# Patient Record
Sex: Female | Born: 1950 | Race: White | Hispanic: No | Marital: Married | Smoking: Current every day smoker
Health system: Southern US, Community
[De-identification: ages and names within clinical notes are randomized; demographics above are authoritative.]

---

## 1998-02-15 ENCOUNTER — Emergency Department (HOSPITAL_COMMUNITY): Admission: EM | Admit: 1998-02-15 | Discharge: 1998-02-15 | Payer: Self-pay | Admitting: Emergency Medicine

## 1998-11-11 ENCOUNTER — Other Ambulatory Visit: Admission: RE | Admit: 1998-11-11 | Discharge: 1998-11-11 | Payer: Self-pay | Admitting: Obstetrics and Gynecology

## 1999-01-16 ENCOUNTER — Ambulatory Visit (HOSPITAL_COMMUNITY): Admission: RE | Admit: 1999-01-16 | Discharge: 1999-01-16 | Payer: Self-pay | Admitting: Obstetrics and Gynecology

## 2000-12-12 ENCOUNTER — Inpatient Hospital Stay (HOSPITAL_COMMUNITY): Admission: EM | Admit: 2000-12-12 | Discharge: 2000-12-14 | Payer: Self-pay

## 2000-12-12 ENCOUNTER — Encounter: Payer: Self-pay | Admitting: Urology

## 2000-12-12 ENCOUNTER — Encounter: Admission: RE | Admit: 2000-12-12 | Discharge: 2000-12-12 | Payer: Self-pay | Admitting: Urology

## 2000-12-14 ENCOUNTER — Encounter: Payer: Self-pay | Admitting: Urology

## 2001-01-16 ENCOUNTER — Ambulatory Visit (HOSPITAL_COMMUNITY): Admission: RE | Admit: 2001-01-16 | Discharge: 2001-01-16 | Payer: Self-pay | Admitting: Urology

## 2001-01-17 ENCOUNTER — Encounter: Payer: Self-pay | Admitting: Urology

## 2001-01-17 ENCOUNTER — Inpatient Hospital Stay (HOSPITAL_COMMUNITY): Admission: EM | Admit: 2001-01-17 | Discharge: 2001-01-20 | Payer: Self-pay | Admitting: Emergency Medicine

## 2001-08-31 ENCOUNTER — Other Ambulatory Visit: Admission: RE | Admit: 2001-08-31 | Discharge: 2001-08-31 | Payer: Self-pay | Admitting: Obstetrics and Gynecology

## 2002-04-25 ENCOUNTER — Encounter: Payer: Self-pay | Admitting: Emergency Medicine

## 2002-04-25 ENCOUNTER — Emergency Department (HOSPITAL_COMMUNITY): Admission: EM | Admit: 2002-04-25 | Discharge: 2002-04-25 | Payer: Self-pay | Admitting: Emergency Medicine

## 2003-10-20 ENCOUNTER — Emergency Department (HOSPITAL_COMMUNITY): Admission: EM | Admit: 2003-10-20 | Discharge: 2003-10-21 | Payer: Self-pay | Admitting: Emergency Medicine

## 2004-02-04 ENCOUNTER — Other Ambulatory Visit: Admission: RE | Admit: 2004-02-04 | Discharge: 2004-02-04 | Payer: Self-pay | Admitting: Obstetrics and Gynecology

## 2005-07-24 ENCOUNTER — Encounter: Admission: RE | Admit: 2005-07-24 | Discharge: 2005-07-24 | Payer: Self-pay | Admitting: Neurosurgery

## 2005-07-30 ENCOUNTER — Inpatient Hospital Stay (HOSPITAL_COMMUNITY): Admission: RE | Admit: 2005-07-30 | Discharge: 2005-07-31 | Payer: Self-pay | Admitting: Neurosurgery

## 2009-03-12 ENCOUNTER — Encounter: Admission: RE | Admit: 2009-03-12 | Discharge: 2009-03-12 | Payer: Self-pay | Admitting: Neurosurgery

## 2010-09-18 NOTE — Discharge Summary (Signed)
[facility]  Patient:    Victoria Aguilar, Victoria Aguilar Visit Number: [id number] MRN: [id number]          Service Type: MED Location: [phone] Attending Physician:  Liborio Nixon Dictated by:   Bertram Millard. Dahlstedt, M.D. Admit Date:  01/17/2001 Discharge Date: 01/20/2001                             Discharge Summary  REASON FOR ADMISSION:  Flank pain.  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  This has been stable with adequate emptying of her kidney recently.  She has been treated for a right ureteral calculus recently.  A stent was placed, which dislodged and fragmented the stone.  The stent was removed in the office recently.  She presented recently with recurrent right flank pain, nausea, and vomiting.  She was seen in the office with significant pain, and I felt that it was necessary to admit this patient for hydration and IV analgesic management.  She has had nausea and vomiting but no fevers or chills.  She denies any gross hematuria.  She has had prior kidney stones with resultant ureteroscopy.  PAST MEDICAL HISTORY:  Past medical history is quite complicated and is significant for several back operations.  She has a chronic pain syndrome and is maintained on methadone.  She is status post a partial hysterectomy.  MEDICATIONS:  Methadone 10 mg four times a day, Percocet one p.o. t.i.d. p.r.n., Valium 5 mg t.i.d. p.r.n., and Estratest.  ALLERGIES:  She is allergic to NONSTEROIDALS.  For prior medical history, please see admission health history.  SOCIAL HISTORY, REVIEW OF SYSTEMS:  Deferred.  PHYSICAL EXAMINATION:  VITAL SIGNS:  Blood pressure 111/82, pulse 114, respiratory rate 18, temperature 98.7.  GENERAL:  A middle-aged female in significant distress.  HEENT:  Unremarkable.  CHEST:  Lungs were clear bilaterally.  CARDIAC:   Regular rhythm, increased rate.  No rubs, gallops, or murmurs.  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  No hepatosplenomegaly or masses were noted.  GENITOURINARY:  Genitalia not examined.  HOSPITAL COURSE:  The patient was admitted directly to my service.  With her being put on a Dilaudid PCA, she became fairly comfortable.  It was found that she had a right distal ureteral stone.  She began having increased pain on the second hospital day.  At that time it was felt necessary to proceed with ureteroscopy.  This was performed, and a stent was placed.  No stone was seen, but there was a small distal ureteral stricture.  The patient tolerated the procedure well.  She was pain-free after this.  She was discharged home in improved condition.  DISCHARGE MEDICATIONS:  She was discharged on the regular doses of methadone and Percocet as well as Estratest.  She was also placed on cephalexin 500 mg t.i.d. and oxybutynin one every eight hours as needed for urinary frequency. DISCHARGE INSTRUCTIONS:  The string was left on the end of the stent.  It was recommended that the patient pull the string out the Monday following discharge.  She will follow up in my office at a future date.  She was discharged on a regular diet. Dictated by:   Bertram Millard. Dahlstedt, M.D. Attending Physician:  Liborio Nixon DD:  03/13/01 TD:  03/13/01 Job: [id number]  [id number]

## 2010-09-18 NOTE — Discharge Summary (Signed)
[facility]  Patient:    Victoria Aguilar, Victoria Aguilar Visit Number: [id number] MRN: [id number]          Service Type: MED Location: [phone] Attending Physician:  Liborio Nixon Dictated by:   Bertram Millard. Dahlstedt, M.D. Adm. Date:  12/12/2000 Disc. Date: 12/14/2000                             Discharge Summary  DIAGNOSES: 1. Right hydronephrosis. 2. Right ureteral calculus. 3. Retroperitoneal fibrosis.  PROCEDURE:  On 12/14/00, cystoscopy, retrograde, double J stent placement.  HISTORY OF PRESENT ILLNESS:  The patient is a 60 year old female with chronic pain, retroperitoneal fibrosis, who recently presented during a CT scan with significant right flank pain, nausea and vomiting.  She is found to have mild right hydronephrosis.  She has a history of retroperitoneal fibrosis and has manageable renal function since the time of diagnosis with either stents or close followup with imaging.  On the day of admission she had intense pain during a CT scan.  She is admitted for further evaluation and treatment of this right flank pain and hydronephrosis.  HOSPITAL COURSE:  The patient was admitted to my service.  She was actually found to have a right ureteral stone.  She underwent cysto and stent placement.  Pain was managed well after the cysto and stent placement.  The patient was discharged home shortly postoperatively.  FOLLOWUP:  She will follow up in my office in one week with a KUB.  We will try to leave the stent in for ureteral dilation so that she may pass the stone on her own.  DISCHARGE MEDICATIONS: 1. Bactrim DS one p.o. q.12h. x 5 days. 2. Tylox p.r.n. 3. Oxybutynin q.5h. p.r.n. bladder spasms. Dictated by:   Bertram Millard. Dahlstedt, M.D. Attending Physician:  Liborio Nixon DD:  12/22/00 TD:  12/23/00 Job: [id number]

## 2010-09-18 NOTE — Op Note (Signed)
[facility]  Patient:    Victoria Aguilar, Victoria Aguilar                     MRN: [id number] Proc. Date: 12/14/00 Adm. Date:  [id number] Attending:  Laqueta Jean                           Operative Report  PREOPERATIVE DIAGNOSIS:  Right ureteral calculus.  POSTOPERATIVE DIAGNOSIS:  Right ureteral calculus.  PRINCIPAL PROCEDURE:  Cystoscopy, right retrograde ureteral pyelogram, double-J stent placement.  SURGEON:  Bertram Millard. Dahlstedt, M.D.  ANESTHESIA:  General.  COMPLICATIONS:  None.  BRIEF HISTORY:  A 60 year old female with a history of retroperitoneal fibrosis.  I first treated her for a stone quite a few years ago.  Since that time, she had developed retroperitoneal fibrosis and has a history of bilateral hydronephrosis.  Her retroperitoneal fibrosis had been quiescent the past few months, and she had had no hydronephrosis.  She had a follow-up CT scan done two days ago.  During the middle of her CT scan at [facility], she had severe right flank pain.  It was felt that the patients pain was due to her kidney.  She was sent to the emergency room here at [facility] and admitted for pain management.  Later, it was found that she did have a mid ureteral stone on the right.  She has had persistent pain, and at this point, presents to the operating room for cystography and retrograde pyelogram, stent placement, possible ureteroscopy.  DESCRIPTION OF PROCEDURE:  The patient was administered a general anesthetic, placed in the dorsal lithotomy position.  Genitalia and perineum were prepped and draped.  A 25 [place] scope was passed into her bladder which appeared normal.  The right ureteral orifice was cannulated with an end hole catheter, and a retrograde was performed.  There was a persistent filling defect about the L3 to L4 level on the right.  There mild to moderate hydroureter beyond this.  Due to the patients retroperitoneal fibrosis, I felt it best to  place a stent and not perform ureteroscopy.  It turns out that several years ago, stent placement itself caused disintegration of the stone.  A guidewire was placed fluoroscopically into the right renal pelvis and over this a 6 French x 24 cm double-J stent was placed.  Adequate positioning was seen fluoroscopically and cystoscopically.  The scope was removed and the procedure terminated.  The patient tolerated the procedure well, was awakened, and taken to PACU in stable condition. DD:  12/14/00 TD:  12/14/00 Job: [id number]

## 2010-09-18 NOTE — Op Note (Signed)
[facility]  Patient:    AILYN, GLADD Visit Number: [id number] MRN: [id number]          Service Type: MED Location: [phone] Attending Physician:  Liborio Nixon Proc. Date: 01/20/01 Admit Date:  01/17/2001 Discharge Date: 01/20/2001                             Operative Report  PREOPERATIVE DIAGNOSES:  Right ureteral calculus, right flank pain.  POSTOPERATIVE DIAGNOSIS:  Right flank pain, no evidence of ureteral calculus.  PRINCIPAL PROCEDURE:  Cystoscopy, right ureteroscopy, double-J stent placement.  SURGEON:  Bertram Millard. Dahlstedt, M.D.  ANESTHESIA:  General endotracheal.  COMPLICATIONS:  None.  BRIEF HISTORY:  A 60 year old female with recurrent episodes of right flank pain.  She was treated several weeks ago for an obstructing right ureteral calculus.  It was not seen at first on the CT scan.  She began having pain during that CT scan.  A stent was placed subsequently, and the patients flank pain resolved.  She has retroperitoneal fibrosis and some ureteral narrowing due to this.  Her stent was removed last week, and she presented two days ago with right flank pain.  Follow-up CT of the abdomen revealed a 1-2 mm stone in her distal right ureter.  She has had persistent pain and has been maintained on a Dilaudid PCA over the past two days.  She continues with pain today.  At this point, she presents for cystoureteroscopy with possible stone extraction. The risks and complications of this procedure are known to the patient.  She desires to proceed.  DESCRIPTION OF PROCEDURE:  The patient was administered a general endotracheal anesthetic, placed in the dorsal lithotomy position.  Genitalia and perineum were prepped and draped.  A 6 [place] micro-ureterscope was passed directly through her urethra and up into the right ureter.  It was easily traversed. Mild ureteral narrowing distally in the right ureter.  I passed the  scope all the way to the renal pelvis on the right, using direct vision.  No stone was seen.  No ureteral abnormalities were noted.  At this point, the scope was withdrawn.  Cystoscope was placed, and a 6 French x 26 cm double-J stent was placed using cystoscopic and fluoroscopic guidance.  The string was left intact.  Bladder drained, scope removed.  The patient tolerated the procedure well.  She was awakened and extubated and taken to PACU in stable condition.  The string was left taped to her thigh. Attending Physician:  Liborio Nixon DD:  01/20/01 TD:  01/20/01 Job: [id number]

## 2010-09-18 NOTE — Op Note (Signed)
[facility]  Patient:    [name], Victoria Aguilar Visit Number: [id number] MRN: [id number]          Service Type: DSU Location: DAY Attending Physician:  Liborio Nixon Proc. Date: 01/16/01 Admit Date:  01/16/2001                             Operative Report  PREOPERATIVE DIAGNOSIS:  Migrated right ureteral stent.  POSTOPERATIVE DIAGNOSIS:  Migrated right ureteral stent.  OPERATION PERFORMED:  Cysto, ureteroscopy, stent extraction.  SURGEON:  Bertram Millard. Dahlstedt, M.D.  ANESTHESIA:  General with LMA.  COMPLICATIONS:  None.  BRIEF HISTORY:  The patient is a 59 year old female recently diagnosed with a right ureteral stone.  A stent was placed to relieve the obstruction.  This was quite a small stone and it was felt that it was fragmented by stent placement.  Stent extraction was attempted last week and the stent had migrated and was not visible in the bladder.   At this point she presents for stent extraction.  DESCRIPTION OF PROCEDURE:  The patient was administered general anesthetic. She was placed in a dorsal lithotomy position.  Genitalia and perineum were prepped and draped.  The ureteroscope was passed into her right ureter. Attempts as grasping the ureter with three-prong graspers and a basket were unsuccessful.  It could be grasped but not extracted properly.  It was right at the ureteral meatus.  The cystoscope was then placed and a grasping device put through this or up into the ureter.  This stent was grasped without much of a difficulty and removed.  The bladder was drained and the procedure terminated.  The patient tolerated the procedure well and was returned to post anesthesia care unit in stable condition. Attending Physician:  Liborio Nixon DD:  01/16/01 TD:  01/16/01 Job: [id number]

## 2010-09-18 NOTE — Discharge Summary (Signed)
[facility]  Patient:    Victoria Aguilar, Victoria Aguilar Visit Number: [id number] MRN: [id number]          Service Type: MED Location: [phone] Attending Physician:  Liborio Nixon Dictated by:   Bertram Millard. Dahlstedt, M.D. Admit Date:  01/17/2001 Discharge Date: 01/20/2001                             Discharge Summary  DIAGNOSES: 1. Right ureteral calculus. 2. Right hydronephrosis. 3. Retroperitoneal fibrosis. 4. Hypertension. 5. History of tobacco abuse.  PRINCIPAL PROCEDURE:  January 20, 2001, right ureteral calculus.  HISTORY OF PRESENT ILLNESS:  A 60 year old female with retroperitoneal fibrosis and intermittent right hydronephrosis.  She was admitted approximately one month prior to this for a right ureteral calculus.  She underwent stent placement.  This apparently dislodged and fragmented the stone which was not seen on recent KUB.  The first stone was removed recently.  She developed significant right flank pain and is readmitted for treatment of hydronephrosis and flank pain.  She does actually, on follow-up, have a small distal ureteral stone on the right.  HOSPITAL COURSE:  Patient was admitted and underwent cysto, ureteroscopy, and stent placement.  No stone was seen on ureteroscopy.  However, after placement of the stent, her pain was alleviated.  The rest of her hospitalization was uneventful and she was discharged home after her ureteroscopy.  She will follow up in my office in one week to follow up.  She will remove her stent the Monday after discharge.  DISCHARGE MEDICATIONS: 1. Keflex 250 mg three times a day. 2. Ditropan one every 8 hours p.r.n. frequency. Dictated by:   Bertram Millard. Dahlstedt, M.D. Attending Physician:  Liborio Nixon DD:  02/24/01 TD:  02/25/01 Job: [id number]

## 2010-09-18 NOTE — H&P (Signed)
[facility]  Patient:    Victoria Aguilar, Victoria Aguilar                     MRN: [id number] Adm. Date:  [id number] Attending:  Laqueta Jean CC:         Victoria Aguilar, M.D.   History and Physical  HISTORY:  Mrs. Victoria Aguilar is a 60 year old, married white female, with a history of chronic bilateral retroperitoneal fibrosis, who developed a severe pain approximately 5 oclock this afternoon, while having CT scan at [facility]. The scan showed the patient to have bilateral hydronephrosis, right greater than left, with dilated right calices, right greater than left.  The patient is a chronic pain patient on methadone, and Percocet, and is having severe right flank pain and right lower quadrant pain, consistent with a renal colic requiring IV Dilaudid in the emergency room.  Because of the patients severe pain, and because of the requirement of IV Dilaudid for pain control, the patient was admitted and will have right percutaneous nephrostomy tube placed tomorrow morning, August 13. I reviewed the x-rays with Dr. ______, and also ______ with Dr. Cherene Julian, who agreed with this plan.  PAST MEDICAL HISTORY:  Significant for multiple surgeries: appendectomy, cholecystectomy, and multiple urinary stents for retroperitoneal fibrosis.  ALLERGIES:  NSAIDs (intolerance), PLASTIC TAPE.  MEDICATIONS: 1. Methadone. 2. Percocet. 3. Estratest. 4. Klonopin. 5. Valium.  PHYSICAL EXAMINATION:  GENERAL:  This is a well-developed overweight white female in acute distress. She is given IV Demerol, IV Dilaudid, and IV Toradol. After all the patients IV pain medication she does not appear to have any difficulty with mentation. She verbalizes normally, and has no obvious effect of narcotic medication. She is interviewed with her husband present.  VITAL SIGNS:  Resting blood pressure of 142/102 but repeat after pain is resolved is 117/70. Pulse 52, respiratory 29.  Temperature 97.7.  HEENT:  PERRLA, and full.  NECK:  Supple, nontender, no nodes.  CHEST:  Clear to P&A.  ABDOMEN:  Right flank pain greater than left flank pain. The abdomen shows right lower quadrant pain. There was no rebound. There is no rebound. There is only mild tenderness. Abdomen is benign and there in no evidence of rebound tenderness.  GENITOURINARY:  Normal female external genitourinary examination.  EXTREMITIES:  Without cyanosis, clubbing, or edema.  NEUROLOGIC:  Physiologic.  ASSESSMENT:  Chronic methadone dependent pain patient, with acute exacerbation of right right flank pain secondary to hydronephrosis, secondary to retroperitoneal fibrosis (? idiopathic).  PLAN:   Admit for IV pain control to night to room 322-B and then will have the patient to have right percutaneous nephrostomy tube placement tomorrow morning, August 13. She will be transferred to Dr. Barbra Sarks service tomorrow morning. DD:  12/12/00 TD:  12/12/00 Job: [id number]

## 2010-09-18 NOTE — Op Note (Signed)
Victoria Aguilar, Victoria Aguilar              ACCOUNT NO.:  [id number]   MEDICAL RECORD NO.:  [id number]          PATIENT TYPE:  INP   LOCATION:  2899                         FACILITY:  [facility]   PHYSICIAN:  Danae Orleans. Venetia Maxon, M.D.  DATE OF BIRTH:  [birth year]   DATE OF PROCEDURE:  07/30/2005  DATE OF DISCHARGE:                                 OPERATIVE REPORT   PREOPERATIVE DIAGNOSIS:  T12 compression fracture and T11 compression  fracture with osteoporosis.   PROCEDURE:  T12 kyphoplasty.   SURGEON:  Danae Orleans. Venetia Maxon, M.D.   ANESTHESIA:  General endotracheal anesthesia.   ESTIMATED BLOOD LOSS:  Minimal.   COMPLICATIONS:  None.   DISPOSITION:  Recovery.   INDICATIONS:  Victoria Aguilar is a 60 year old woman with multilevel thoracic  compression fractures.  She has a significant compression fracture of T12  and significant pain as a result of that.  It was elected to perform a  kyphoplasty procedure at this fractured vertebral level.   PROCEDURE:  Victoria Aguilar was brought to the operating room.  She was placed in  a prone position on the operating table, with chest and pelvic rolls.  Her  back was then prepped and draped in the usual sterile fashion.  Using  intraoperative fluoroscopy, both in AP and lateral planes, the T12 level was  identified initially from the left pedicle and, from a left transpedicular  approach, the trocar was inserted into the vertebral body and the pressures  were actually fairly high.  The drill was used to enter the vertebral body  and upon placing an inflatable bone tamp, pressures of up to 200 PSI were  obtained.  With inflation of the bone tamp, there appeared to be a defect  created in the inferior vertebral wall, so consequently it was elected not  to do a bone cement fill from this side, and a right-sided trajectory was  then utilized and, on this side, the inflatable bone tamp was filled with  some restoration of vertebral body height, but again evidence of an  inferior  cleft was identified so limited filling was performed, and at this level  fairly high pressures were required to get inflation of the inflatable bone  tamp.  Subsequently, the bone cement was mixed to fairly thick consistency  and, under AP and lateral fluoroscopy, the bone cement was injected into the  fractured vertebra.  There was good filling from side to side, and also in  AP plane.  Some small amount of extravasation of bone cement into the  inferior disk space was identified and the filling was stopped at this  point.  The trocars were removed with no evidence of any tails of bone  cement.  Because the pressures were quite high, it was initially planned of  performing a T11 kyphoplasty as well as both of these levels appeared to  have acute on chronic fracture, but because the pressures of this more  severely fractured level were quite high, it was felt it would not be likely  of great benefit to the patient to perform a kyphoplasty of the T11 level.  Subsequently, 3-0 Vicryl  interrupted stitches were placed at either separate stab incision, and the  wound was dressed with Dermabond.  The patient was extubated in the  operating room and taken to the recovery room in stable and satisfactory  condition, having tolerated the operation well.  Counts were correct at the  end of the case.      Danae Orleans. Venetia Maxon, M.D.  Electronically Signed     JDS/MEDQ  D:  07/30/2005  T:  08/01/2005  Job:  [id number]

## 2016-06-14 ENCOUNTER — Other Ambulatory Visit (HOSPITAL_COMMUNITY): Payer: Self-pay | Admitting: Interventional Radiology

## 2016-06-14 DIAGNOSIS — I2601 Septic pulmonary embolism with acute cor pulmonale: Secondary | ICD-10-CM

## 2016-07-29 ENCOUNTER — Other Ambulatory Visit: Payer: Self-pay

## 2016-08-12 ENCOUNTER — Ambulatory Visit
Admission: RE | Admit: 2016-08-12 | Discharge: 2016-08-12 | Disposition: A | Discharge status: Home / Self Care | Payer: Self-pay | Source: Ambulatory Visit | Attending: Interventional Radiology | Admitting: Interventional Radiology

## 2016-08-12 DIAGNOSIS — I2601 Septic pulmonary embolism with acute cor pulmonale: Secondary | ICD-10-CM

## 2016-08-12 HISTORY — PX: IR RADIOLOGIST EVAL & MGMT: IMG5224

## 2016-08-12 NOTE — Progress Notes (Signed)
Chief Complaint: Patient was seen in follow up today for presence of an IVC filter at the request of Lynnsey Barbara  Referring Physician(s): Lametria Klunk  History of Present Illness: Victoria Aguilar is a 66 y.o. 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 She recently became supratherapeutic while on anticoagulation with Coumadin and suffered a spontaneous hemorrhage necessitating cessation of anticoagulation during the recovery period.  Therefore, a potentially retrievable IVC filter was placed on 06/07/2016 to provide PE prophylaxis during the period she was off her anticoagulation medicine. She presents today to discuss filter retrieval.  She is back on Coumadin and tolerating anti-coagulation very well. She recently had checked and is in the normal therapeutic range. She has had no further episodes of bleeding.  She denies complaints of lower extremity pain or swelling. No abdominal complaints to suggest convocation related to the presence of the filter.  She denies chest pain or shortness of breath.  No past medical history on file.  No past surgical history on file.  Allergies: Nsaids and Sulfamethoxazole-trimethoprim  Medications: Prior to Admission medications   Medication Sig Start Date End Date Taking? Authorizing Provider  acyclovir (ZOVIRAX) 400 MG tablet Take 400 mg by mouth 5 (five) times daily.    Historical Provider, MD     No family history on file.  Social History   Social History  . Marital status: Married    Spouse name: N/A  . Number of children: N/A  . Years of education: N/A   Social History Main Topics  . Smoking status: Current Every Day Smoker    Packs/day: 0.50    Years: 41.00    Types: Cigarettes    Start date: 08/13/1975  . Smokeless tobacco: Never Used  . Alcohol use No  . Drug use: No  . Sexual activity: Not on file   Other Topics Concern  . Not on file   Social History  Narrative  . No narrative on file    Review of Systems: A 12 point ROS discussed and pertinent positives are indicated in the HPI above.  All other systems are negative.  Review of Systems  Vital Signs: There were no vitals taken for this visit.  Physical Exam  Constitutional: She is oriented to person, place, and time. She appears well-developed and well-nourished. No distress.  HENT:  Head: Normocephalic and atraumatic.  Eyes: No scleral icterus.  Musculoskeletal: She exhibits no edema.  Neurological: She is alert and oriented to person, place, and time.  Skin: Skin is warm and dry.  Psychiatric: She has a normal mood and affect. Her behavior is normal.  Nursing note and vitals reviewed.   Imaging: No results found.  Labs:  CBC: No results for input(s): WBC, HGB, HCT, PLT in the last 8760 hours.  COAGS: No results for input(s): INR, APTT in the last 8760 hours.  BMP: No results for input(s): NA, K, CL, CO2, GLUCOSE, BUN, CALCIUM, CREATININE, GFRNONAA, GFRAA in the last 8760 hours.  Invalid input(s): CMP  LIVER FUNCTION TESTS: No results for input(s): BILITOT, AST, ALT, ALKPHOS, PROT, ALBUMIN in the last 8760 hours.  TUMOR MARKERS: No results for input(s): AFPTM, CEA, CA199, CHROMGRNA in the last 8760 hours.  Assessment and Plan:  [name] is doing well and back on her Coumadin. Her retroperitoneal hematoma has resolved. She is tolerating anticoagulation well.  Her ultrasound today demonstrated no evidence of new or acute DVT.  1.) schedule for IVC retrievable  at [facility].    Electronically Signed: Malachy Moan 08/12/2016, 2:38 PM   I spent a total of  15 Minutes in face to face in clinical consultation, greater than 50% of which was counseling/coordinating care for presence of IVC filter.

## 2016-10-15 ENCOUNTER — Encounter: Payer: Self-pay | Admitting: Interventional Radiology

## 2018-11-19 IMAGING — US US EXTREM LOW VENOUS BILAT
1 series · 12 of 24 positions shown · non-contrast
Comparison: None.

CLINICAL DATA: 65-year-old female with a history of factor 5
Joujma, recurrent DVT and PE. She currently has an IVC filter in
place to provide prophylaxis during a period off of anticoagulation.
Assess for new DVT formation prior to IVC filter retrieval.



[Series 1: us extrem low venous bilat · 0.06mm/px · 12 of 68 slices shown]
[im 3/68]
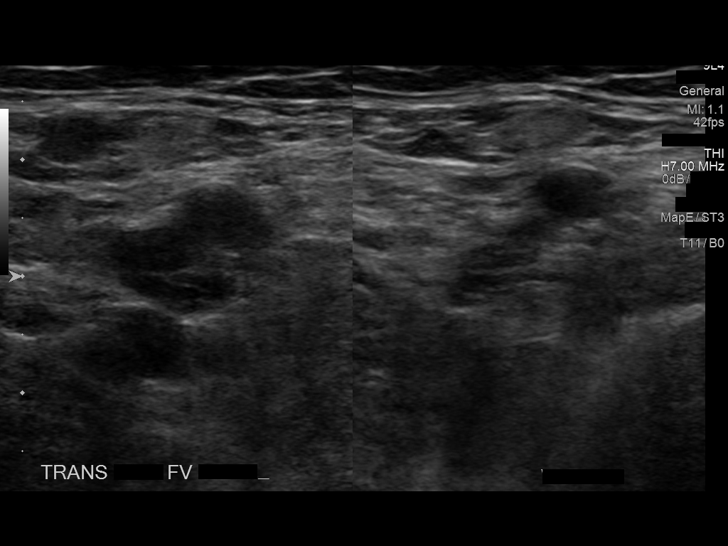
[im 9/68]
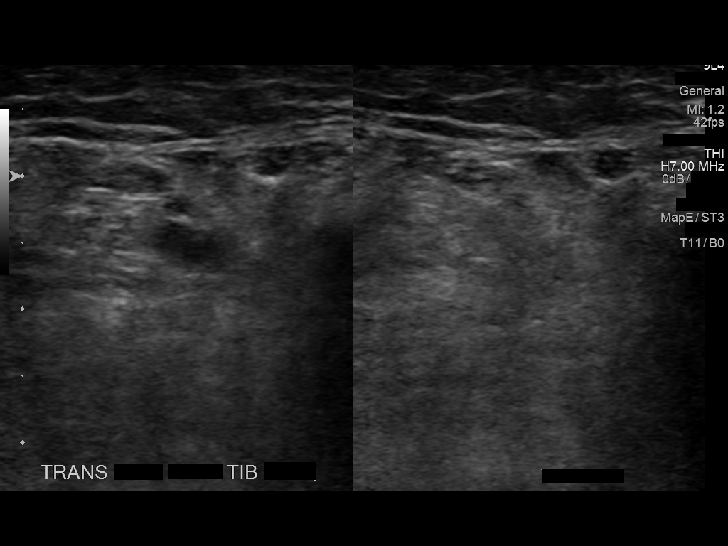
[im 15/68]
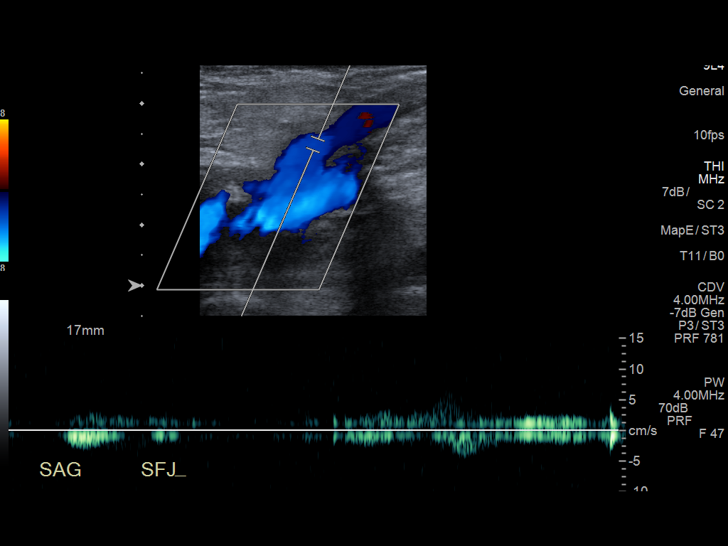
[im 21/68]
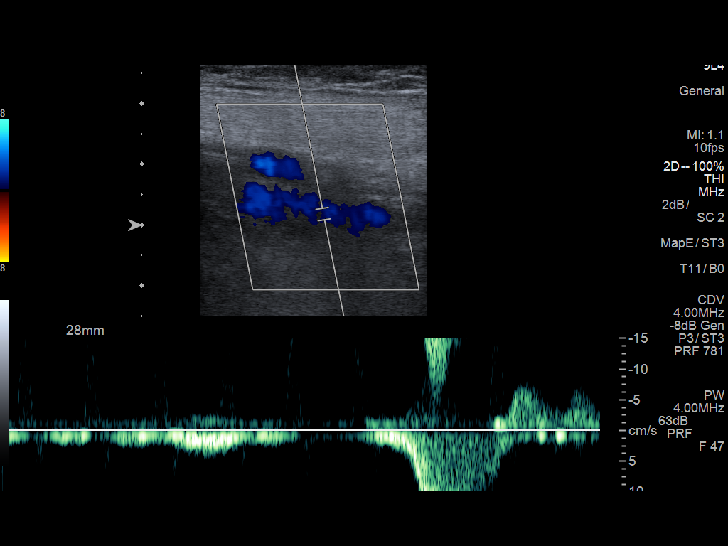
[im 27/68]
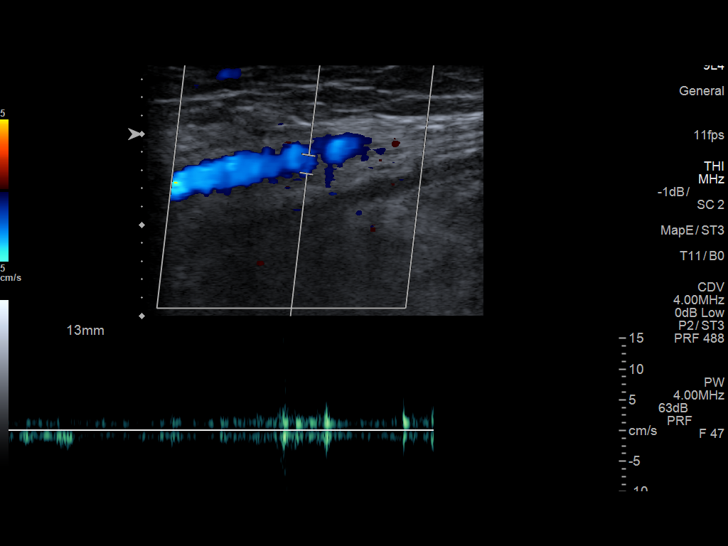
[im 33/68]
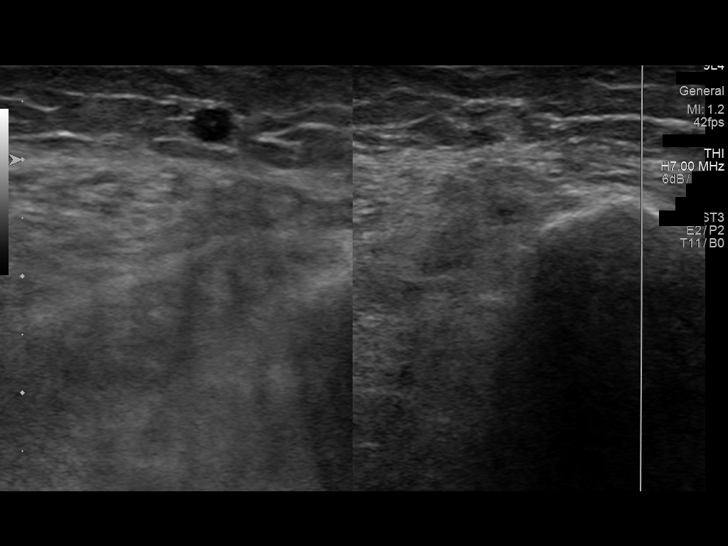
[im 38/68]
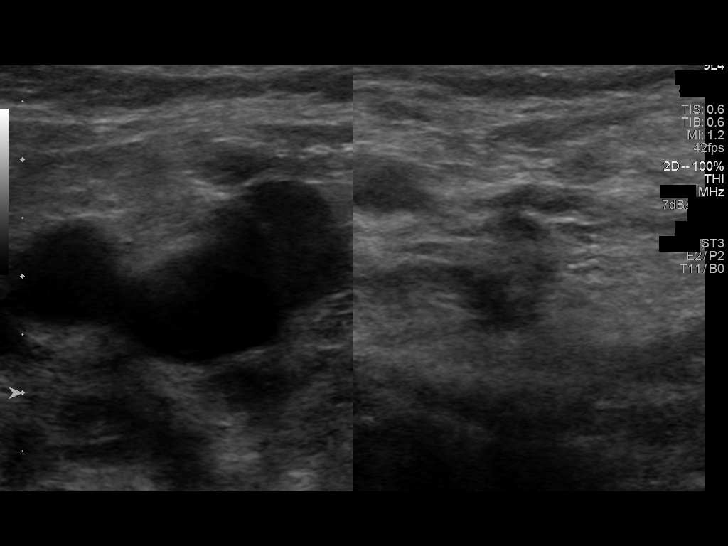
[im 44/68]
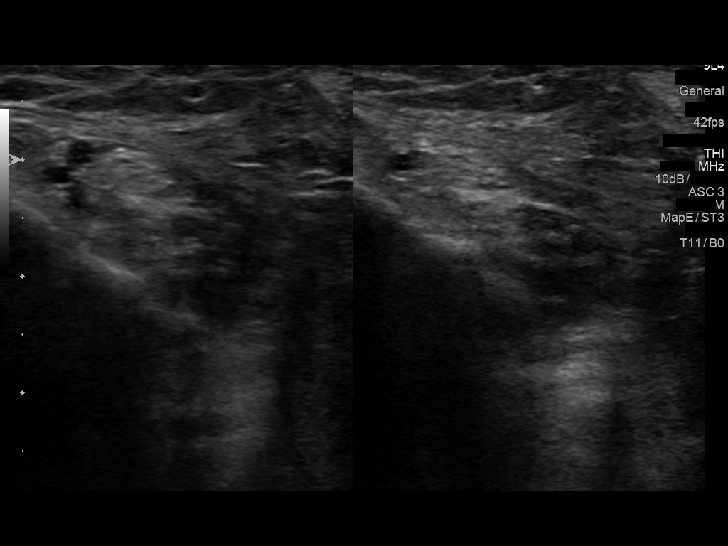
[im 50/68]
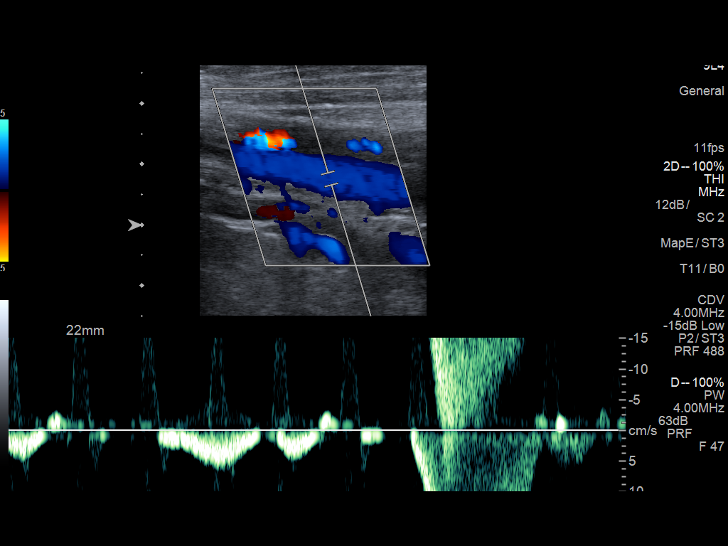
[im 56/68]
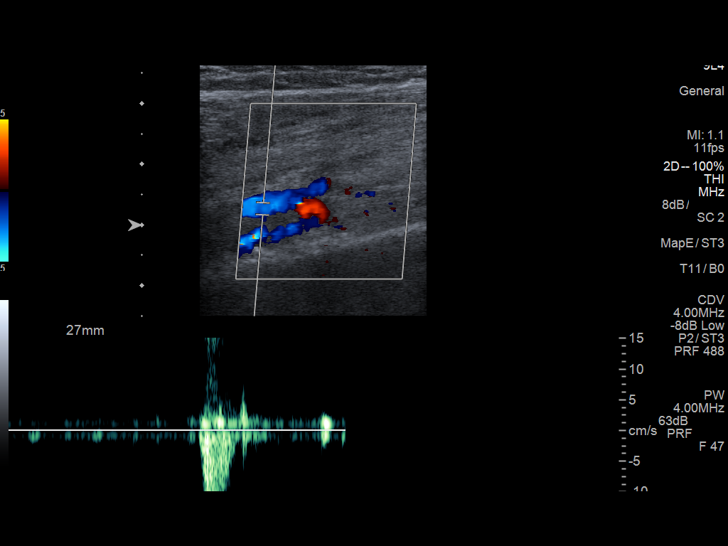
[im 62/68]
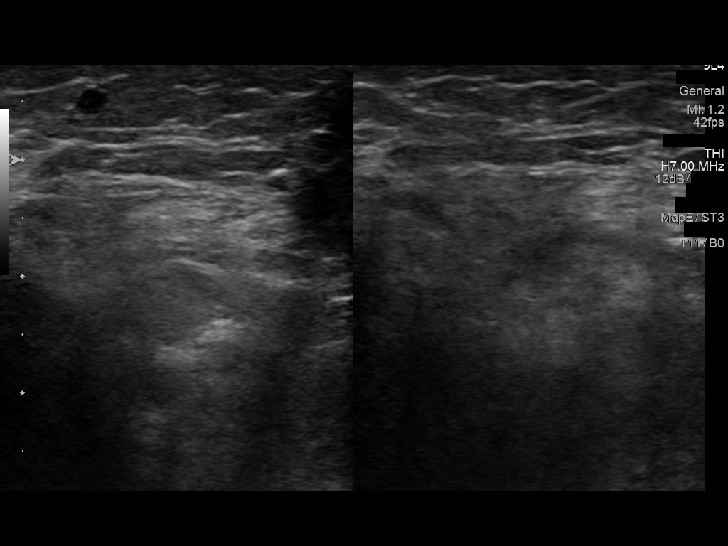
[im 68/68]
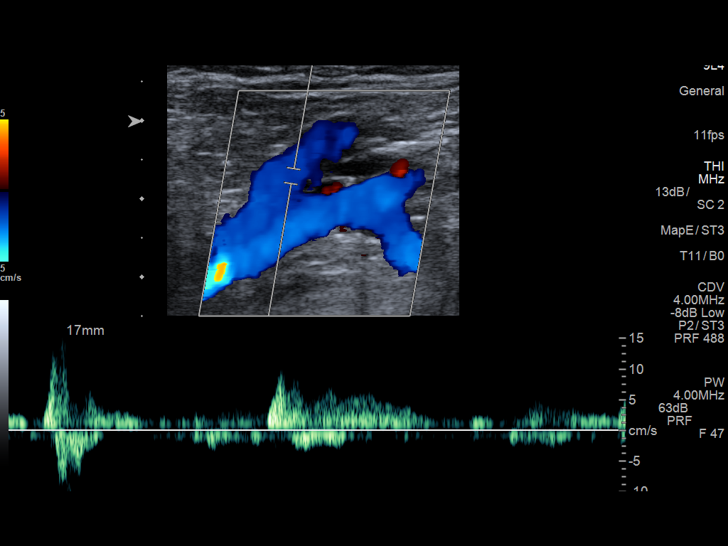

[12 of 24 positions shown; findings below may reference images not displayed]

FINDINGS: RIGHT LOWER EXTREMITY

Common Femoral Vein: No evidence of thrombus. Normal
compressibility, respiratory phasicity and response to augmentation.

Saphenofemoral Junction: No evidence of thrombus. Normal
compressibility and flow on color Doppler imaging.

Profunda Femoral Vein: No evidence of thrombus. Normal
compressibility and flow on color Doppler imaging.

Femoral Vein: No evidence of thrombus. Normal compressibility,
respiratory phasicity and response to augmentation.

Popliteal Vein: No evidence of thrombus. Normal compressibility,
respiratory phasicity and response to augmentation.

Calf Veins: No evidence of thrombus. Normal compressibility and flow
on color Doppler imaging.

Superficial Great Saphenous Vein: No evidence of thrombus. Normal
compressibility and flow on color Doppler imaging.

Venous Reflux:  None.

Other Findings:  None.

LEFT LOWER EXTREMITY

Common Femoral Vein: The vessel is compressible. There are thin
linear echogenic foci within the vein consistent with webbing from
prior DVT. No evidence of acute DVT.

Saphenofemoral Junction: No evidence of thrombus. Normal
compressibility and flow on color Doppler imaging.

Profunda Femoral Vein: No evidence of thrombus. Normal
compressibility and flow on color Doppler imaging.

Femoral Vein: No evidence of acute DVT. The vessel remains
compressible. There is minimal webbing from prior DVT.

Popliteal Vein: No evidence of thrombus. Normal compressibility,
respiratory phasicity and response to augmentation.

Calf Veins: No evidence of thrombus. Normal compressibility and flow
on color Doppler imaging.

Superficial Great Saphenous Vein: No evidence of thrombus. Normal
compressibility and flow on color Doppler imaging.

Venous Reflux:  None.

Other Findings:  None.
IMPRESSION: 1. No evidence of acute DVT in either lower extremity.
2. Minimal residual webbing from recanalized chronic DVT in the left
common femoral vein and in the femoral vein in the midthigh.

## 2019-10-02 DEATH — deceased
